# Patient Record
Sex: Male | Born: 1995 | Race: White | Hispanic: No | Marital: Single | Smoking: Never smoker
Health system: Southern US, Community
[De-identification: ages and names within clinical notes are randomized; demographics above are authoritative.]

## PROBLEM LIST (undated history)

## (undated) DIAGNOSIS — R011 Cardiac murmur, unspecified: Secondary | ICD-10-CM

## (undated) HISTORY — DX: Cardiac murmur, unspecified: R01.1

---
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
## 2019-08-18 ENCOUNTER — Other Ambulatory Visit: Payer: Self-pay | Admitting: Internal Medicine

## 2019-08-18 ENCOUNTER — Ambulatory Visit
Admission: RE | Admit: 2019-08-18 | Discharge: 2019-08-18 | Disposition: A | Discharge status: Home / Self Care | Payer: No Typology Code available for payment source | Source: Ambulatory Visit | Attending: Internal Medicine | Admitting: Internal Medicine

## 2019-08-18 DIAGNOSIS — M79645 Pain in left finger(s): Secondary | ICD-10-CM

## 2021-02-19 IMAGING — CR DG FINGER MIDDLE 2+V*L*
3 series · 3 of 3 positions shown · non-contrast
Comparison: None.

CLINICAL DATA: Pain, injury

EXAM:
LEFT MIDDLE FINGER 2+V

[x finger pa left]
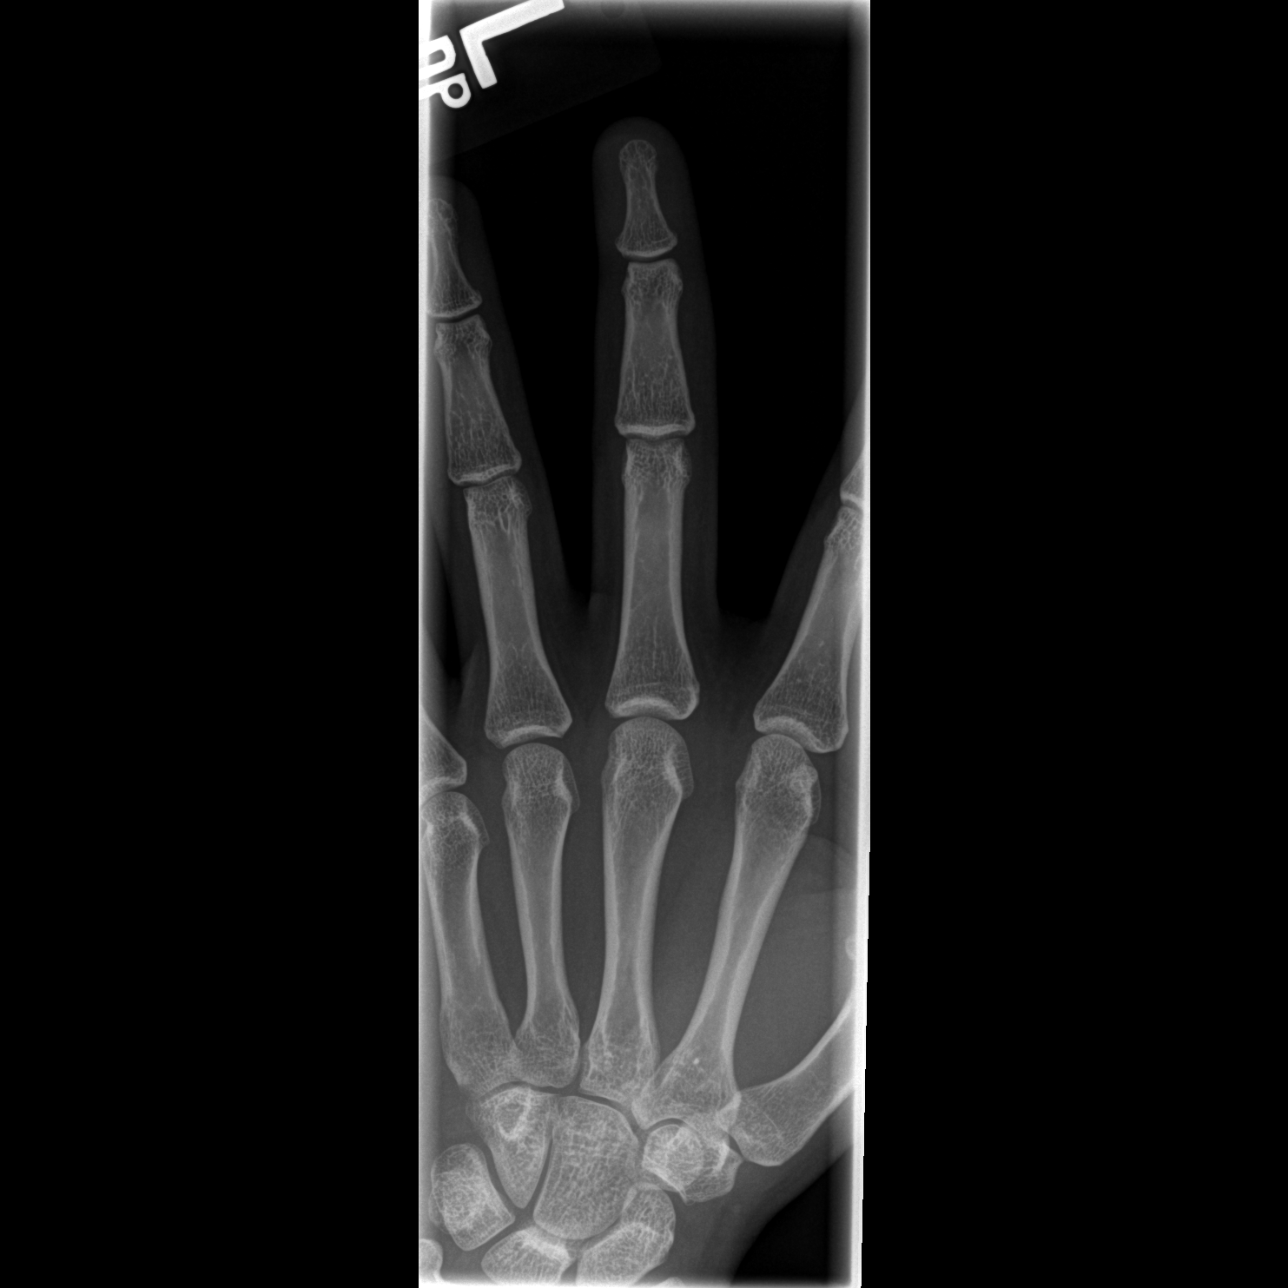

[x finger obl. left]
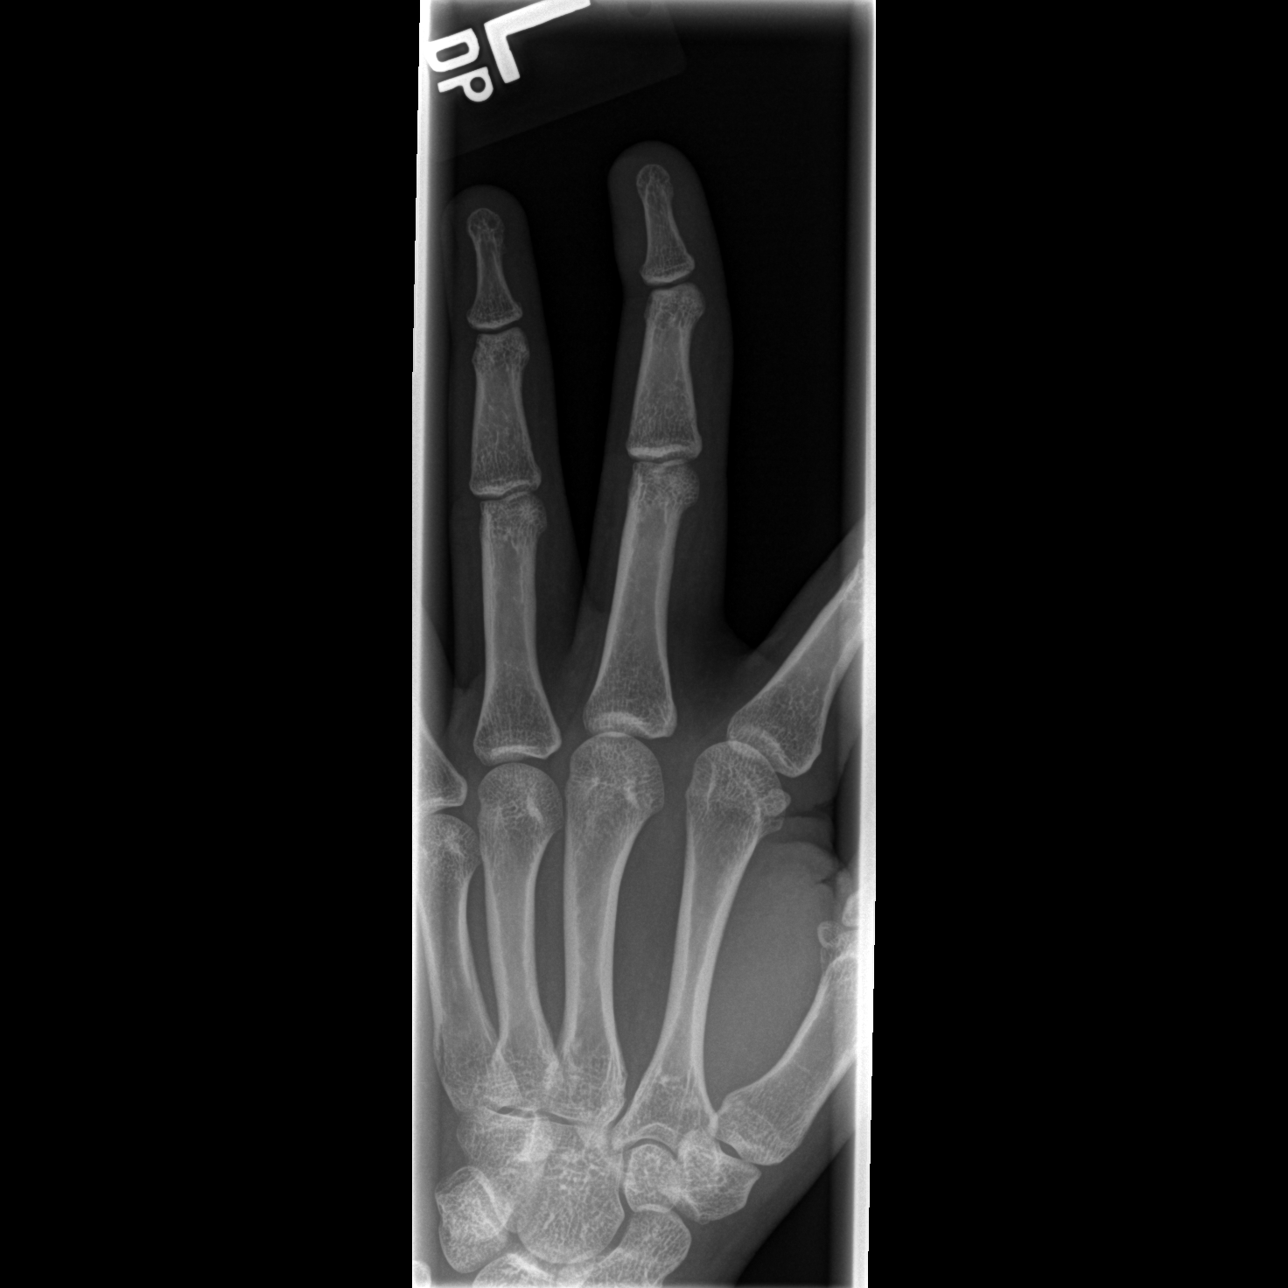

[x finger lateral left]
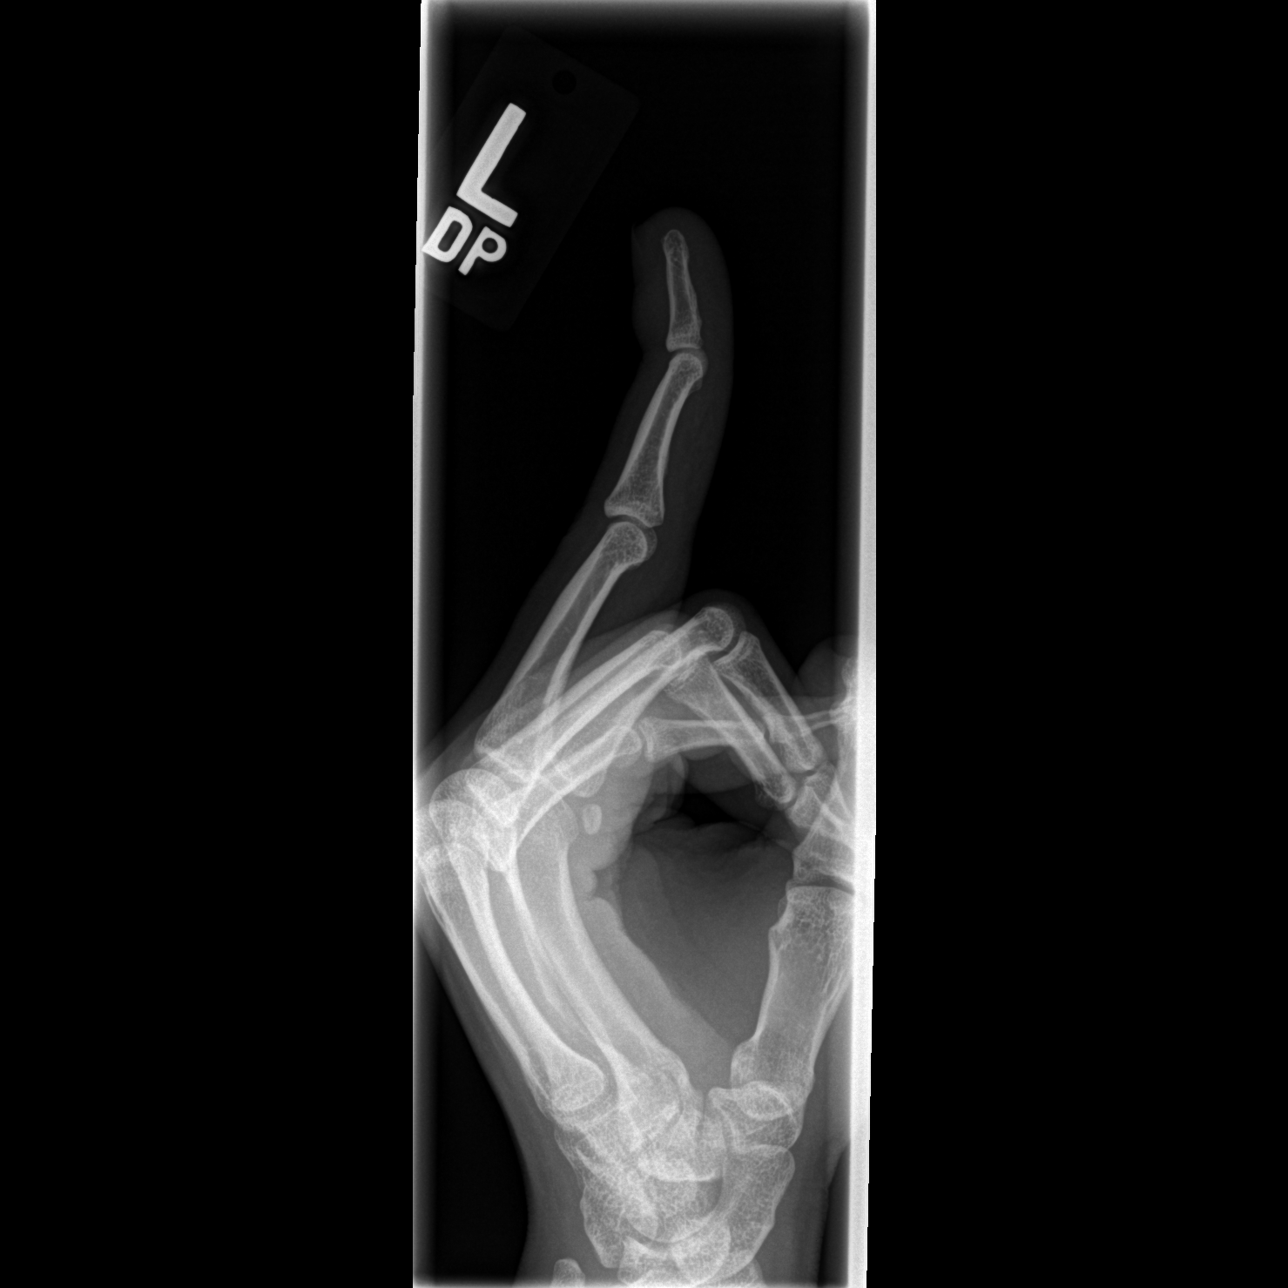

[3 of 3 positions shown; findings below may reference images not displayed]

FINDINGS: There is no evidence of fracture or dislocation. Hyperextension of
the third distal interphalangeal joint. There is no evidence of
arthropathy or other focal bone abnormality. Soft tissue edema of
the distal third digit.
IMPRESSION: No fracture or dislocation of the left third digit. There is
hyperextension of the third distal interphalangeal joint; MRI may be
used to assess for ligamentous integrity if indicated by clinical
examination.
